# Patient Record
Sex: Male | Born: 1995 | Race: White | Hispanic: No | Marital: Single | Smoking: Never smoker
Health system: Southern US, Community
[De-identification: ages and names within clinical notes are randomized; demographics above are authoritative.]

## PROBLEM LIST (undated history)

## (undated) DIAGNOSIS — N200 Calculus of kidney: Secondary | ICD-10-CM

---

## 2019-11-08 ENCOUNTER — Encounter (HOSPITAL_BASED_OUTPATIENT_CLINIC_OR_DEPARTMENT_OTHER): Payer: Self-pay | Admitting: Emergency Medicine

## 2019-11-08 ENCOUNTER — Other Ambulatory Visit: Payer: Self-pay

## 2019-11-08 ENCOUNTER — Emergency Department (HOSPITAL_BASED_OUTPATIENT_CLINIC_OR_DEPARTMENT_OTHER)
Admission: EM | Admit: 2019-11-08 | Discharge: 2019-11-08 | Disposition: A | Discharge status: Home / Self Care | Payer: 59 | Attending: Emergency Medicine | Admitting: Emergency Medicine

## 2019-11-08 DIAGNOSIS — R1032 Left lower quadrant pain: Secondary | ICD-10-CM | POA: Diagnosis present

## 2019-11-08 DIAGNOSIS — N132 Hydronephrosis with renal and ureteral calculous obstruction: Secondary | ICD-10-CM | POA: Diagnosis not present

## 2019-11-08 DIAGNOSIS — N200 Calculus of kidney: Secondary | ICD-10-CM

## 2019-11-08 LAB — URINALYSIS, ROUTINE W REFLEX MICROSCOPIC
Bilirubin Urine: NEGATIVE
Glucose, UA: NEGATIVE mg/dL
Ketones, ur: NEGATIVE mg/dL
Leukocytes,Ua: NEGATIVE
Nitrite: NEGATIVE
Protein, ur: NEGATIVE mg/dL
Specific Gravity, Urine: >1.03 — ABNORMAL HIGH (ref 1.005–1.030)
pH: 6 (ref 5.0–8.0)

## 2019-11-08 LAB — COMPREHENSIVE METABOLIC PANEL
ALT: 21 U/L (ref 0–44)
AST: 21 U/L (ref 15–41)
Albumin: 4.3 g/dL (ref 3.5–5.0)
Alkaline Phosphatase: 69 U/L (ref 38–126)
Anion gap: 8 (ref 5–15)
BUN: 16 mg/dL (ref 6–20)
CO2: 25 mmol/L (ref 22–32)
Calcium: 9.4 mg/dL (ref 8.9–10.3)
Chloride: 108 mmol/L (ref 98–111)
Creatinine, Ser: 1.18 mg/dL (ref 0.61–1.24)
GFR calc Af Amer: >60 mL/min (ref >60)
GFR calc non Af Amer: >60 mL/min (ref >60)
Glucose, Bld: 133 mg/dL — ABNORMAL HIGH (ref 70–99)
Potassium: 4.1 mmol/L (ref 3.5–5.1)
Sodium: 141 mmol/L (ref 135–145)
Total Bilirubin: 0.9 mg/dL (ref 0.3–1.2)
Total Protein: 6.7 g/dL (ref 6.5–8.1)

## 2019-11-08 LAB — CBC
HCT: 42.8 % (ref 39.0–52.0)
Hemoglobin: 14.1 g/dL (ref 13.0–17.0)
MCH: 30.5 pg (ref 26.0–34.0)
MCHC: 32.9 g/dL (ref 30.0–36.0)
MCV: 92.6 fL (ref 80.0–100.0)
Platelets: 188 10*3/uL (ref 150–400)
RBC: 4.62 MIL/uL (ref 4.22–5.81)
RDW: 13.5 % (ref 11.5–15.5)
WBC: 5.1 10*3/uL (ref 4.0–10.5)
nRBC: 0 % (ref 0.0–0.2)

## 2019-11-08 LAB — URINALYSIS, MICROSCOPIC (REFLEX): RBC / HPF: >50 RBC/hpf (ref 0–5)

## 2019-11-08 MED ORDER — HYDROMORPHONE HCL 1 MG/ML IJ SOLN
0.5000 mg | Freq: Once | INTRAMUSCULAR | Status: AC
  Administered 2019-11-08: 0.5 mg via INTRAVENOUS
  Filled 2019-11-08: qty 1

## 2019-11-08 MED ORDER — ONDANSETRON HCL 4 MG/2ML IJ SOLN
4.0000 mg | Freq: Once | INTRAMUSCULAR | Status: AC
  Administered 2019-11-08: 08:00:00 4 mg via INTRAVENOUS

## 2019-11-08 MED ORDER — HYDROMORPHONE HCL 1 MG/ML IJ SOLN
0.5000 mg | Freq: Once | INTRAMUSCULAR | Status: AC
  Administered 2019-11-08: 0.5 mg via INTRAVENOUS
  Filled 2019-11-08 (×2): qty 1

## 2019-11-08 MED ORDER — HYDROMORPHONE HCL 1 MG/ML IJ SOLN
0.5000 mg | Freq: Once | INTRAMUSCULAR | Status: AC
  Administered 2019-11-08: 09:00:00 0.5 mg via INTRAVENOUS
  Filled 2019-11-08: qty 1

## 2019-11-08 MED ORDER — NAPROXEN 500 MG PO TABS: 500.0000 mg | ORAL_TABLET | Freq: Two times a day (BID) | ORAL | 0 refills | Status: AC

## 2019-11-08 MED ORDER — OXYCODONE HCL 5 MG PO TABS: 5.0000 mg | ORAL_TABLET | ORAL | 0 refills | Status: DC | PRN

## 2019-11-08 MED ORDER — ONDANSETRON 4 MG PO TBDP
4.0000 mg | ORAL_TABLET | Freq: Once | ORAL | Status: AC
  Administered 2019-11-08: 4 mg via ORAL
  Filled 2019-11-08: qty 1

## 2019-11-08 MED ORDER — KETOROLAC TROMETHAMINE 15 MG/ML IJ SOLN
15.0000 mg | Freq: Once | INTRAMUSCULAR | Status: AC
  Administered 2019-11-08: 15 mg via INTRAVENOUS
  Filled 2019-11-08: qty 1

## 2019-11-08 MED ORDER — ONDANSETRON HCL 4 MG/2ML IJ SOLN
INTRAMUSCULAR | Status: AC
  Filled 2019-11-08: qty 2

## 2019-11-08 MED ORDER — SODIUM CHLORIDE 0.9 % IV BOLUS
1000.0000 mL | Freq: Once | INTRAVENOUS | Status: AC
  Administered 2019-11-08: 1000 mL via INTRAVENOUS

## 2019-11-08 MED ORDER — ONDANSETRON 4 MG PO TBDP: 4.0000 mg | ORAL_TABLET | Freq: Three times a day (TID) | ORAL | 0 refills | Status: DC | PRN

## 2019-11-08 NOTE — ED Notes (Signed)
Pt was nauseated and vomited agagin , pt pale states pain is back some

## 2019-11-08 NOTE — ED Triage Notes (Signed)
Left flank/back pain since this am.  Pt denies fever.  Pt states it started after voiding this am.

## 2019-11-08 NOTE — Discharge Instructions (Signed)
You were evaluated in the Emergency Department and after careful evaluation, we did not find any emergent condition requiring admission or further testing in the hospital.  Your exam/testing today is overall reassuring.  Symptoms seem to be due to a kidney stone.  Please take the Naprosyn anti-inflammatory medication twice daily.  You can use the oxycodone medication for more significant pain.  We recommend follow-up with a urologist.  Please return to the Emergency Department if you experience any worsening of your condition.  We encourage you to follow up with a primary care provider.  Thank you for allowing [place] to be a part of your care.

## 2019-11-08 NOTE — ED Provider Notes (Signed)
[facility]EM[facility] DEPT [facility] Emergency Department Provider Note MRN:  [id number]  Arrival date & time: 11/08/19     Chief Complaint   Flank pain History of Present Illness   Joshua Vega is a 24 y.o. year-old male with no pertinent past medical history presenting to the ED with chief complaint of flank pain.  Location: Left flank Duration: 3 or 4 hours Onset: Sudden Timing: Constant Description: Sharp Severity: Severe Exacerbating/Alleviating Factors: None, cannot find a comfortable position Associated Symptoms: Diaphoresis, nausea, dry heaves, no chest pain, no shortness of breath, no abdominal pain, no dysuria, no hematuria Pertinent Negatives: As above   Review of Systems  A complete 10 system review of systems was obtained and all systems are negative except as noted in the HPI and PMH.   Patient's Health History   No past medical history on file.  No prior history of kidney stones, no prior history of abdominal surgeries   No family history on file.  Social History   Socioeconomic History  . Marital status: Single    Spouse name: Not on file  . Number of children: Not on file  . Years of education: Not on file  . Highest education level: Not on file  Occupational History  . Not on file  Tobacco Use  . Smoking status: Never Smoker  . Smokeless tobacco: Never Used  Substance and Sexual Activity  . Alcohol use: Not on file  . Drug use: Not on file  . Sexual activity: Not on file  Other Topics Concern  . Not on file  Social History Narrative  . Not on file   Social Determinants of Health   Financial Resource Strain:   . Difficulty of Paying Living Expenses: Not on file  Food Insecurity:   . Worried About Programme researcher, broadcasting/film/video in the Last Year: Not on file  . Ran Out of Food in the Last Year: Not on file  Transportation Needs:   . Lack of Transportation (Medical): Not on file  . Lack of Transportation (Non-Medical): Not on file  Physical  Activity:   . Days of Exercise per Week: Not on file  . Minutes of Exercise per Session: Not on file  Stress:   . Feeling of Stress : Not on file  Social Connections:   . Frequency of Communication with Friends and Family: Not on file  . Frequency of Social Gatherings with Friends and Family: Not on file  . Attends Religious Services: Not on file  . Active Member of Clubs or Organizations: Not on file  . Attends Banker Meetings: Not on file  . Marital Status: Not on file  Intimate Partner Violence:   . Fear of Current or Ex-Partner: Not on file  . Emotionally Abused: Not on file  . Physically Abused: Not on file  . Sexually Abused: Not on file     Physical Exam   Vitals:   11/08/19 0705 11/08/19 0830  BP: (!) 145/100 139/87  Pulse: (!) 55 (!) 55  Resp: 20 16  Temp: 97.9 F (36.6 C)   SpO2: 99% 100%    CONSTITUTIONAL: Well-appearing, NAD, mildly diaphoretic NEURO:  Alert and oriented x 3, no focal deficits EYES:  eyes equal and reactive ENT/NECK:  no LAD, no JVD CARDIO: Bradycardic rate, well-perfused, normal S1 and S2 PULM:  CTAB no wheezing or rhonchi GI/GU:  normal bowel sounds, non-distended, non-tender; mild left CVA tenderness MSK/SPINE:  No gross deformities, no edema SKIN:  no  rash, atraumatic PSYCH:  Appropriate speech and behavior  *Additional and/or pertinent findings included in MDM below  Diagnostic and Interventional Summary    EKG Interpretation  Date/Time:    Ventricular Rate:    PR Interval:    QRS Duration:   QT Interval:    QTC Calculation:   R Axis:     Text Interpretation:        Cardiac Monitoring Interpretation:  Labs Reviewed  COMPREHENSIVE METABOLIC PANEL - Abnormal; Notable for the following components:      Result Value   Glucose, Bld 133 (*)    All other components within normal limits  URINALYSIS, ROUTINE W REFLEX MICROSCOPIC - Abnormal; Notable for the following components:   APPearance CLOUDY (*)     Specific Gravity, Urine >1.030 (*)    Hgb urine dipstick LARGE (*)    All other components within normal limits  URINALYSIS, MICROSCOPIC (REFLEX) - Abnormal; Notable for the following components:   Bacteria, UA FEW (*)    All other components within normal limits  CBC    No orders to display    Medications  HYDROmorphone (DILAUDID) injection 0.5 mg (has no administration in time range)  ketorolac (TORADOL) 15 MG/ML injection 15 mg (15 mg Intravenous Given 11/08/19 0747)  HYDROmorphone (DILAUDID) injection 0.5 mg (0.5 mg Intravenous Given 11/08/19 0747)  sodium chloride 0.9 % bolus 1,000 mL (0 mLs Intravenous Stopped 11/08/19 0839)  ondansetron (ZOFRAN) injection 4 mg (4 mg Intravenous Given 11/08/19 0747)  HYDROmorphone (DILAUDID) injection 0.5 mg (0.5 mg Intravenous Given 11/08/19 0839)  HYDROmorphone (DILAUDID) injection 0.5 mg (0.5 mg Intravenous Given 11/08/19 0946)     Procedures  /  Critical Care Ultrasound ED Renal  Date/Time: 11/08/2019 9:15 AM Performed by: Maudie Flakes, MD Authorized by: Maudie Flakes, MD   Procedure details:    Indications comment:  Flank pain   Technique:  L kidney and R kidneyImages: archived Left kidney findings:    Hydronephrosis: moderate   Right kidney findings:    Hydronephrosis: none      ED Course and Medical Decision Making  I have reviewed the triage vital signs, the nursing notes, and pertinent available records from the EMR.  Pertinent labs & imaging results that were available during my care of the patient were reviewed by me and considered in my medical decision making (see below for details).     Suspect kidney stone, will obtain labs, provide pain control, bedside ultrasound to follow.  10:30 AM update: Ultrasound as described above, pretty clearly with moderate hydro on the left side.  With this finding and the large blood on urinalysis, the diagnosis of kidney stone is largely in hand.  Labs reassuring, urinalysis without infection.   Patient required a number of doses of Dilaudid for pain control and he was observed in the ED for a few hours but we were eventually able to obtain adequate pain control for discharge, advised urology follow-up.  Barth Kirks. Sedonia Small, [place] [email or web address]  Final Clinical Impressions(s) / ED Diagnoses     ICD-10-CM   1. Kidney stone  N20.0     ED Discharge Orders         Ordered    oxyCODONE (ROXICODONE) 5 MG immediate release tablet  Every 4 hours PRN     11/08/19 1037    naproxen (NAPROSYN) 500 MG tablet  2 times daily     11/08/19 1037  Discharge Instructions Discussed with and Provided to Patient:     Discharge Instructions     You were evaluated in the Emergency Department and after careful evaluation, we did not find any emergent condition requiring admission or further testing in the hospital.  Your exam/testing today is overall reassuring.  Symptoms seem to be due to a kidney stone.  Please take the Naprosyn anti-inflammatory medication twice daily.  You can use the oxycodone medication for more significant pain.  We recommend follow-up with a urologist.  Please return to the Emergency Department if you experience any worsening of your condition.  We encourage you to follow up with a primary care provider.  Thank you for allowing [place] to be a part of your care.       Sabas Sous, MD 11/08/19 1039

## 2020-09-26 ENCOUNTER — Encounter (HOSPITAL_BASED_OUTPATIENT_CLINIC_OR_DEPARTMENT_OTHER): Payer: Self-pay | Admitting: *Deleted

## 2020-09-26 ENCOUNTER — Other Ambulatory Visit: Payer: Self-pay

## 2020-09-26 ENCOUNTER — Emergency Department (HOSPITAL_BASED_OUTPATIENT_CLINIC_OR_DEPARTMENT_OTHER): Payer: 59

## 2020-09-26 ENCOUNTER — Emergency Department (HOSPITAL_BASED_OUTPATIENT_CLINIC_OR_DEPARTMENT_OTHER)
Admission: EM | Admit: 2020-09-26 | Discharge: 2020-09-26 | Disposition: A | Discharge status: Home / Self Care | Payer: 59 | Attending: Emergency Medicine | Admitting: Emergency Medicine

## 2020-09-26 DIAGNOSIS — R0789 Other chest pain: Secondary | ICD-10-CM

## 2020-09-26 DIAGNOSIS — U071 COVID-19: Secondary | ICD-10-CM | POA: Diagnosis not present

## 2020-09-26 DIAGNOSIS — R001 Bradycardia, unspecified: Secondary | ICD-10-CM | POA: Insufficient documentation

## 2020-09-26 HISTORY — DX: Calculus of kidney: N20.0

## 2020-09-26 LAB — CBC
HCT: 40.4 % (ref 39.0–52.0)
Hemoglobin: 14.6 g/dL (ref 13.0–17.0)
MCH: 31.3 pg (ref 26.0–34.0)
MCHC: 36.1 g/dL — ABNORMAL HIGH (ref 30.0–36.0)
MCV: 86.5 fL (ref 80.0–100.0)
Platelets: 199 10*3/uL (ref 150–400)
RBC: 4.67 MIL/uL (ref 4.22–5.81)
RDW: 11.9 % (ref 11.5–15.5)
WBC: 6.4 10*3/uL (ref 4.0–10.5)
nRBC: 0 % (ref 0.0–0.2)

## 2020-09-26 LAB — BASIC METABOLIC PANEL
Anion gap: 8 (ref 5–15)
BUN: 22 mg/dL — ABNORMAL HIGH (ref 6–20)
CO2: 27 mmol/L (ref 22–32)
Calcium: 9 mg/dL (ref 8.9–10.3)
Chloride: 103 mmol/L (ref 98–111)
Creatinine, Ser: 1.12 mg/dL (ref 0.61–1.24)
GFR, Estimated: >60 mL/min (ref >60)
Glucose, Bld: 95 mg/dL (ref 70–99)
Potassium: 3.3 mmol/L — ABNORMAL LOW (ref 3.5–5.1)
Sodium: 138 mmol/L (ref 135–145)

## 2020-09-26 NOTE — Discharge Instructions (Signed)
Work-up for the chest pain without any acute findings.  To return for any chest pain lasting 15 minutes or longer or for any shortness of breath.

## 2020-09-26 NOTE — ED Provider Notes (Signed)
[facility] EMERGENCY DEPARTMENT Provider Note   CSN: [id number] Arrival date & time: 09/26/20  1900     History Chief Complaint  Patient presents with  . Covid Positive    Joshua Vega is a 25 y.o. male.  Patient with onset of COVID symptoms 8 days ago.  Patient's had a positive COVID test.  Patient came in mostly out of concern for left anterior chest pain that lasted less than 2 minutes.  Certainly less than 15 minutes.  He denies to me any shortness of breath.  He also denies any pleuritic type chest pain.  Denies any leg swelling.  When the sharp pain occurred he felt dizzy.  The pain occurred approximately 2 hours prior to arrival.  He has had none since.        Past Medical History:  Diagnosis Date  . Kidney stone     There are no problems to display for this patient.   History reviewed. No pertinent surgical history.     No family history on file.  Social History   Tobacco Use  . Smoking status: Never Smoker  . Smokeless tobacco: Never Used  Substance Use Topics  . Alcohol use: Not Currently  . Drug use: Not Currently    Home Medications Prior to Admission medications   Medication Sig Start Date End Date Taking? Authorizing Provider  naproxen (NAPROSYN) 500 MG tablet Take 1 tablet (500 mg total) by mouth 2 (two) times daily. 11/08/19   Sabas Sous, MD  ondansetron (ZOFRAN ODT) 4 MG disintegrating tablet Take 1 tablet (4 mg total) by mouth every 8 (eight) hours as needed for nausea or vomiting. 11/08/19   Sabas Sous, MD  oxyCODONE (ROXICODONE) 5 MG immediate release tablet Take 1 tablet (5 mg total) by mouth every 4 (four) hours as needed for severe pain. 11/08/19   Sabas Sous, MD    Allergies    Patient has no known allergies.  Review of Systems   Review of Systems  Constitutional: Negative for chills and fever.  HENT: Negative for congestion, rhinorrhea and sore throat.   Eyes: Negative for visual disturbance.  Respiratory:  Positive for cough. Negative for shortness of breath.   Cardiovascular: Positive for chest pain. Negative for leg swelling.  Gastrointestinal: Negative for abdominal pain, diarrhea, nausea and vomiting.  Genitourinary: Negative for dysuria.  Musculoskeletal: Negative for back pain and neck pain.  Skin: Negative for rash.  Neurological: Negative for dizziness, light-headedness and headaches.  Hematological: Does not bruise/bleed easily.  Psychiatric/Behavioral: Negative for confusion.    Physical Exam Updated Vital Signs BP (!) 146/95 (BP Location: Right Arm)   Pulse 60   Resp 14   Ht 1.854 m (6\' 1" )   Wt 108.9 kg   SpO2 99%   BMI 31.66 kg/m   Physical Exam Vitals and nursing note reviewed.  Constitutional:      Appearance: Normal appearance. He is well-developed and well-nourished.  HENT:     Head: Normocephalic and atraumatic.  Eyes:     Extraocular Movements: Extraocular movements intact.     Conjunctiva/sclera: Conjunctivae normal.     Pupils: Pupils are equal, round, and reactive to light.  Cardiovascular:     Rate and Rhythm: Regular rhythm. Bradycardia present.     Heart sounds: No murmur heard.   Pulmonary:     Effort: Pulmonary effort is normal. No respiratory distress.     Breath sounds: Normal breath sounds.  Chest:  Chest wall: No tenderness.  Abdominal:     Palpations: Abdomen is soft.     Tenderness: There is no abdominal tenderness.  Musculoskeletal:        General: No edema.     Cervical back: Neck supple.  Skin:    General: Skin is warm and dry.     Capillary Refill: Capillary refill takes less than 2 seconds.  Neurological:     General: No focal deficit present.     Mental Status: He is alert and oriented to person, place, and time.     Cranial Nerves: No cranial nerve deficit.     Sensory: No sensory deficit.     Motor: No weakness.  Psychiatric:        Mood and Affect: Mood and affect normal.     ED Results / Procedures / Treatments    Labs (all labs ordered are listed, but only abnormal results are displayed) Labs Reviewed  CBC - Abnormal; Notable for the following components:      Result Value   MCHC 36.1 (*)    All other components within normal limits  BASIC METABOLIC PANEL - Abnormal; Notable for the following components:   Potassium 3.3 (*)    BUN 22 (*)    All other components within normal limits    EKG EKG Interpretation  Date/Time:  Tuesday September 26 2020 22:33:08 EST Ventricular Rate:  59 PR Interval:    QRS Duration: 98 QT Interval:  407 QTC Calculation: 404 R Axis:   70 Text Interpretation: Sinus rhythm No previous ECGs available Confirmed by Vanetta Mulders [phone]) on 09/26/2020 10:41:20 PM   Radiology DG Chest Portable 1 View  Result Date: 09/26/2020 CLINICAL DATA:  Cough EXAM: PORTABLE CHEST 1 VIEW COMPARISON:  None. FINDINGS: The heart size and mediastinal contours are within normal limits. Both lungs are clear. The visualized skeletal structures are unremarkable. IMPRESSION: No active disease. Electronically Signed   By: Jasmine Pang M.D.   On: 09/26/2020 19:27    Procedures Procedures (including critical care time)  Medications Ordered in ED Medications - No data to display  ED Course  I have reviewed the triage vital signs and the nursing notes.  Pertinent labs & imaging results that were available during my care of the patient were reviewed by me and considered in my medical decision making (see chart for details).    MDM Rules/Calculators/A&P                          Chest pain was very brief.  In the face of the COVID diagnosis made clinical decision not to check troponin.  No clinical concerns for pulmonary embolus either.  Cardiac monitoring sinus bradycardia without any arrhythmias.  Chest x-ray was negative no signs of pneumonia or pneumothorax.  Patient's oxygen levels on room air are 99%.  Patient's basic labs Significant abnormalities other than a mild hypokalemia  with a potassium of 3.3.  Patient nontoxic no acute distress.  Stable for discharge home.  Return for any chest pain lasting 15 minutes or longer.  Or for any shortness of breath.     Final Clinical Impression(s) / ED Diagnoses Final diagnoses:  COVID  Atypical chest pain    Rx / DC Orders ED Discharge Orders    None       Vanetta Mulders, MD 09/26/20 2328

## 2020-09-26 NOTE — ED Notes (Signed)
Presents with having a sharp pain at left ant chest, breast area, approx 2 hours ago. Stated he felt dizzy immediately when pain occurs. He is approx 12 days out from having Covid.

## 2020-09-26 NOTE — ED Triage Notes (Signed)
Covid + x 7 days, pro cough and Chest pain with deep breathing x 1 day

## 2021-12-20 IMAGING — DX DG CHEST 1V PORT
1 series · 1 of 1 positions shown · non-contrast
Comparison: None.

CLINICAL DATA: Cough

EXAM:
PORTABLE CHEST 1 VIEW

[chest ap]
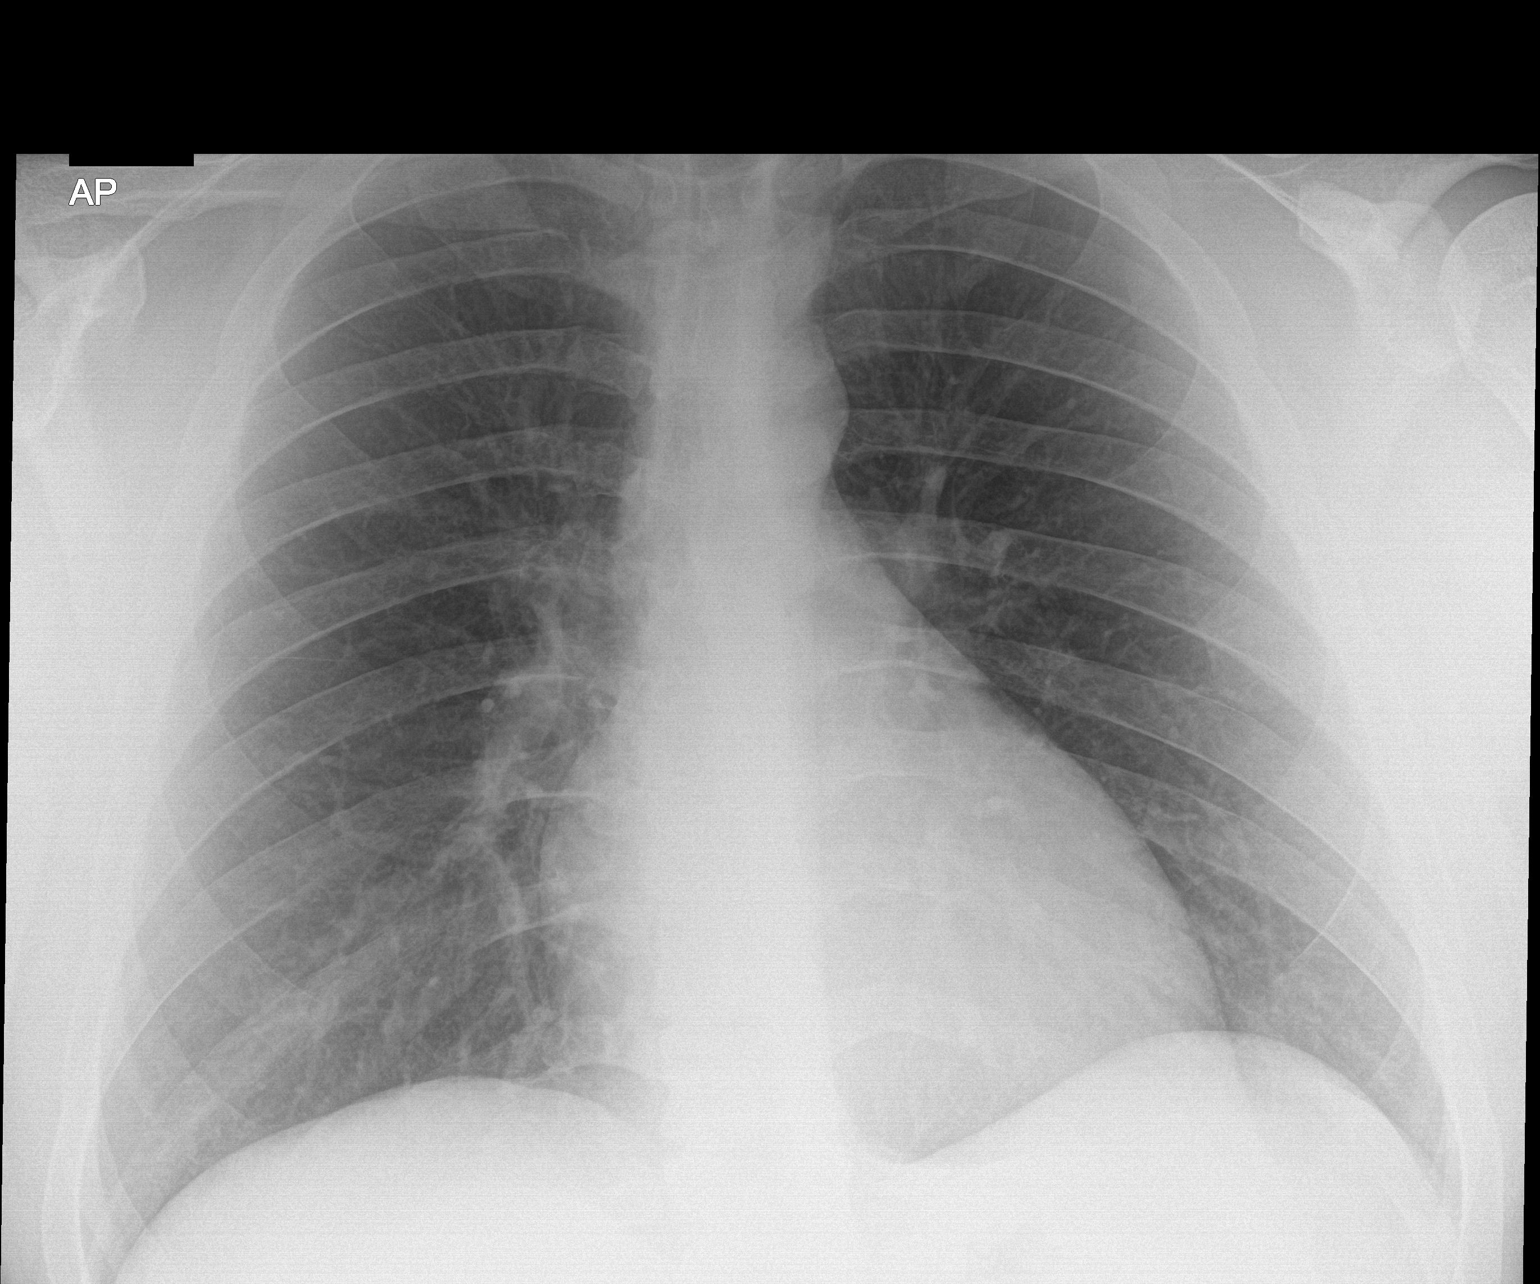

[1 of 1 positions shown; findings below may reference images not displayed]

FINDINGS: The heart size and mediastinal contours are within normal limits.
Both lungs are clear. The visualized skeletal structures are
unremarkable.
IMPRESSION: No active disease.

## 2023-09-02 ENCOUNTER — Emergency Department (HOSPITAL_BASED_OUTPATIENT_CLINIC_OR_DEPARTMENT_OTHER): Payer: 59

## 2023-09-02 ENCOUNTER — Other Ambulatory Visit: Payer: Self-pay

## 2023-09-02 ENCOUNTER — Encounter (HOSPITAL_BASED_OUTPATIENT_CLINIC_OR_DEPARTMENT_OTHER): Payer: Self-pay | Admitting: Emergency Medicine

## 2023-09-02 ENCOUNTER — Emergency Department (HOSPITAL_BASED_OUTPATIENT_CLINIC_OR_DEPARTMENT_OTHER)
Admission: EM | Admit: 2023-09-02 | Discharge: 2023-09-02 | Disposition: A | Discharge status: Home / Self Care | Payer: 59 | Attending: Emergency Medicine | Admitting: Emergency Medicine

## 2023-09-02 DIAGNOSIS — N200 Calculus of kidney: Secondary | ICD-10-CM

## 2023-09-02 DIAGNOSIS — N132 Hydronephrosis with renal and ureteral calculous obstruction: Secondary | ICD-10-CM | POA: Insufficient documentation

## 2023-09-02 DIAGNOSIS — R109 Unspecified abdominal pain: Secondary | ICD-10-CM | POA: Diagnosis present

## 2023-09-02 LAB — URINALYSIS, ROUTINE W REFLEX MICROSCOPIC
Bilirubin Urine: NEGATIVE
Glucose, UA: NEGATIVE mg/dL
Ketones, ur: NEGATIVE mg/dL
Leukocytes,Ua: NEGATIVE
Nitrite: NEGATIVE
Protein, ur: 30 mg/dL — AB
Specific Gravity, Urine: >=1.03 (ref 1.005–1.030)
pH: 6.5 (ref 5.0–8.0)

## 2023-09-02 LAB — URINALYSIS, MICROSCOPIC (REFLEX): RBC / HPF: >50 RBC/hpf (ref 0–5)

## 2023-09-02 MED ORDER — ONDANSETRON HCL 4 MG PO TABS: 4.0000 mg | ORAL_TABLET | Freq: Three times a day (TID) | ORAL | 0 refills | Status: DC | PRN

## 2023-09-02 MED ORDER — OXYCODONE-ACETAMINOPHEN 5-325 MG PO TABS: 1.0000 | ORAL_TABLET | Freq: Three times a day (TID) | ORAL | 0 refills | Status: DC | PRN

## 2023-09-02 MED ORDER — ONDANSETRON HCL 4 MG PO TABS: 4.0000 mg | ORAL_TABLET | Freq: Three times a day (TID) | ORAL | 0 refills | Status: AC | PRN

## 2023-09-02 MED ORDER — KETOROLAC TROMETHAMINE 15 MG/ML IJ SOLN
15.0000 mg | Freq: Once | INTRAMUSCULAR | Status: AC
  Administered 2023-09-02: 15 mg via INTRAMUSCULAR
  Filled 2023-09-02: qty 1

## 2023-09-02 MED ORDER — ONDANSETRON 4 MG PO TBDP
4.0000 mg | ORAL_TABLET | Freq: Once | ORAL | Status: AC
  Administered 2023-09-02: 4 mg via ORAL
  Filled 2023-09-02: qty 1

## 2023-09-02 MED ORDER — MORPHINE SULFATE (PF) 2 MG/ML IV SOLN
2.0000 mg | Freq: Once | INTRAVENOUS | Status: AC
  Administered 2023-09-02: 2 mg via INTRAMUSCULAR
  Filled 2023-09-02: qty 1

## 2023-09-02 MED ORDER — OXYCODONE-ACETAMINOPHEN 5-325 MG PO TABS: 1.0000 | ORAL_TABLET | Freq: Three times a day (TID) | ORAL | 0 refills | Status: AC | PRN

## 2023-09-02 MED ORDER — TAMSULOSIN HCL 0.4 MG PO CAPS
0.4000 mg | ORAL_CAPSULE | Freq: Once | ORAL | Status: AC
  Administered 2023-09-02: 0.4 mg via ORAL
  Filled 2023-09-02: qty 1

## 2023-09-02 NOTE — ED Provider Notes (Signed)
[place] EMERGENCY DEPARTMENT AT [facility] Provider Note   CSN: [id number] Arrival date & time: 09/02/23  2047     History  Chief Complaint  Patient presents with   Flank Pain    Joshua Vega is a 27 y.o. male past medical history significant for kidney stones presents today for right flank pain that began around 10 AM this morning.  Patient also endorses nausea with 2 episodes of emesis.  Patient denies abdominal pain, diarrhea, sore throat, shortness of breath, or chest pain.  Patient also notices increased urge to urinate.  Patient denies dysuria or hematuria.   Flank Pain       Home Medications Prior to Admission medications   Medication Sig Start Date End Date Taking? Authorizing Provider  ondansetron (ZOFRAN) 4 MG tablet Take 1 tablet (4 mg total) by mouth every 8 (eight) hours as needed for nausea or vomiting. 09/02/23  Yes Dolphus Jenny, PA-C  oxyCODONE-acetaminophen (PERCOCET/ROXICET) 5-325 MG tablet Take 1 tablet by mouth every 8 (eight) hours as needed for up to 3 days for severe pain (pain score 7-10). 09/02/23 09/05/23 Yes Dolphus Jenny, PA-C  naproxen (NAPROSYN) 500 MG tablet Take 1 tablet (500 mg total) by mouth 2 (two) times daily. 11/08/19   Sabas Sous, MD      Allergies    Patient has no known allergies.    Review of Systems   Review of Systems  Gastrointestinal:  Positive for nausea and vomiting.  Genitourinary:  Positive for flank pain and urgency.    Physical Exam Updated Vital Signs BP (!) 125/105 (BP Location: Left Arm)   Pulse 63   Temp 97.7 F (36.5 C)   Resp 20   Ht 6\' 1"  (1.854 m)   Wt 131.5 kg   SpO2 96%   BMI 38.26 kg/m  Physical Exam Vitals and nursing note reviewed.  Constitutional:      General: He is not in acute distress.    Appearance: He is well-developed.  HENT:     Head: Normocephalic and atraumatic.     Right Ear: External ear normal.     Left Ear: External ear normal.     Nose: Nose normal.      Mouth/Throat:     Mouth: Mucous membranes are moist.  Eyes:     Extraocular Movements: Extraocular movements intact.     Conjunctiva/sclera: Conjunctivae normal.  Cardiovascular:     Rate and Rhythm: Normal rate and regular rhythm.     Pulses: Normal pulses.     Heart sounds: Normal heart sounds. No murmur heard. Pulmonary:     Effort: Pulmonary effort is normal. No respiratory distress.     Breath sounds: Normal breath sounds. No wheezing.  Abdominal:     General: Bowel sounds are normal.     Palpations: Abdomen is soft.     Tenderness: There is no abdominal tenderness. There is right CVA tenderness. There is no left CVA tenderness or guarding.  Musculoskeletal:        General: No swelling. Normal range of motion.     Cervical back: Normal range of motion and neck supple.  Skin:    General: Skin is warm and dry.     Capillary Refill: Capillary refill takes less than 2 seconds.  Neurological:     General: No focal deficit present.     Mental Status: He is alert.     Motor: No weakness.  Psychiatric:  Mood and Affect: Mood normal.     ED Results / Procedures / Treatments   Labs (all labs ordered are listed, but only abnormal results are displayed) Labs Reviewed  URINALYSIS, ROUTINE W REFLEX MICROSCOPIC - Abnormal; Notable for the following components:      Result Value   Hgb urine dipstick LARGE (*)    Protein, ur 30 (*)    All other components within normal limits  URINALYSIS, MICROSCOPIC (REFLEX) - Abnormal; Notable for the following components:   Bacteria, UA RARE (*)    All other components within normal limits    EKG None  Radiology CT Renal Stone Study Result Date: 09/02/2023 CLINICAL DATA:  Right flank pain. EXAM: CT ABDOMEN AND PELVIS WITHOUT CONTRAST TECHNIQUE: Multidetector CT imaging of the abdomen and pelvis was performed following the standard protocol without IV contrast. RADIATION DOSE REDUCTION: This exam was performed according to the  departmental dose-optimization program which includes automated exposure control, adjustment of the mA and/or kV according to patient size and/or use of iterative reconstruction technique. COMPARISON:  None Available. FINDINGS: Lower chest: No abnormality. Hepatobiliary: The liver is 20 cm length with mild steatosis. No mass is seen without contrast. The gallbladder and bile ducts are unremarkable. Pancreas: Unremarkable without contrast. Spleen: No focal abnormality. Mild splenomegaly. Splenic length 15 cm. Adrenals/Urinary Tract: There is no adrenal mass. On the left there are scattered punctate up to 1 mm nonobstructive caliceal stones in the upper and lower pole. On the right there is a 2 mm nonobstructive stone in the upper pole, 1 mm caliceal stone in the lower pole, and mild hydroureteronephrosis due to a 3 mm UVJ stone. Both ureters are otherwise clear. There is no contour deforming mass of either of the unenhanced kidneys. There is slight right perinephric edema. The bladder is not well seen due to contraction but there are no inflammatory changes around it. Stomach/Bowel: Unremarkable gastric wall. Normal caliber unopacified small bowel, normal appendix. No findings of acute colitis or diverticulitis. Vascular/Lymphatic: No significant vascular findings are present. No enlarged abdominal or pelvic lymph nodes. Reproductive: Prostate is unremarkable. Other: No abdominal wall hernia or abnormality. No abdominopelvic ascites. Musculoskeletal: No acute or significant osseous findings. IMPRESSION: 1. 3 mm right UVJ stone with mild hydroureteronephrosis and slight perinephric edema. Correlate clinically for infectious complication. 2. Bilateral nonobstructive nephrolithiasis. 3. Mild hepatosplenomegaly with mild hepatic steatosis. Electronically Signed   By: Almira Bar M.D.   On: 09/02/2023 21:49    Procedures Procedures    Medications Ordered in ED Medications  ketorolac (TORADOL) 15 MG/ML  injection 15 mg (15 mg Intramuscular Given 09/02/23 2124)  ondansetron (ZOFRAN-ODT) disintegrating tablet 4 mg (4 mg Oral Given 09/02/23 2124)  morphine (PF) 2 MG/ML injection 2 mg (2 mg Intramuscular Given 09/02/23 2159)  tamsulosin (FLOMAX) capsule 0.4 mg (0.4 mg Oral Given 09/02/23 2159)    ED Course/ Medical Decision Making/ A&P                                 Medical Decision Making Amount and/or Complexity of Data Reviewed Labs: ordered. Radiology: ordered.  Risk Prescription drug management.   This patient presents to the ED with chief complaint(s) of right flank pain with pertinent past medical history of kidney stones which further complicates the presenting complaint. The complaint involves an extensive differential diagnosis and also carries with it a high risk of complications and morbidity.    The  differential diagnosis includes kidney stone, UTI, pyelonephritis  Additional history obtained: Records reviewed Care Everywhere/External Records  ED Course and Reassessment: Patient given IM Toradol and oral Zofran for nausea and pain.  Independent labs interpretation:  The following labs were independently interpreted:  UA: Large hemoglobin, 30 protein, rare bacteria, greater than 50 RBCs  Independent visualization of imaging: - I independently visualized the following imaging with scope of interpretation limited to determining acute life threatening conditions related to emergency care: CT renal stone study, which revealed 3 mm right UVJ stone with mild hydroureteronephrosis and slight perinephric edema.  Bilateral nonobstructive nephrolithiasis.  Consultation: - Consulted or discussed management/test interpretation w/ external professional: None  Consideration for admission or further workup: Considered for mission further workup however patient's vital signs, physical exam, labs, and imaging above are reassuring.  Patient symptoms likely due to right-sided kidney  stone.  Patient given dose of Flomax in the ER.  In short course of outpatient Zofran for nausea and vomiting and Percocet for pain.  Patient should follow-up with PCP if symptoms persist for further evaluation.        Final Clinical Impression(s) / ED Diagnoses Final diagnoses:  Nephrolithiasis    Rx / DC Orders ED Discharge Orders          Ordered    ondansetron (ZOFRAN) 4 MG tablet  Every 8 hours PRN        09/02/23 2155    oxyCODONE-acetaminophen (PERCOCET/ROXICET) 5-325 MG tablet  Every 8 hours PRN        09/02/23 2204              Dolphus Jenny, PA-C 09/02/23 2205    Charlynne Pander, MD 09/02/23 2235

## 2023-09-02 NOTE — ED Notes (Signed)
Patient transported to CT 

## 2023-09-02 NOTE — Discharge Instructions (Addendum)
Today you were seen for kidney stones.  Please pick up your Zofran and take as needed for nausea and vomiting and your Percocet for pain.  You may alternate taking Tylenol Motrin as needed for pain.  Please do not take Motrin for greater than 5 days in a row as this may cause rebound headaches.  Thank you for letting us treat you today. After reviewing your labs and imaging, I feel you are safe to go home. Please follow up with your PCP in the next several days and provide them with your records from this visit. Return to the Emergency Room if pain becomes severe or symptoms worsen.

## 2023-09-02 NOTE — ED Triage Notes (Signed)
Pt c/o Rt Flank pain, hx of kidney stones, denies any dysuria or hematuria

## 2023-09-02 NOTE — ED Notes (Signed)
Initial contact made. Pt is resting in bed. Pt states he has been having 8/10 flank pain, feeling like he has to urinate all day today but unable to do so
